# Patient Record
Sex: Male | Born: 1960 | Race: White | Hispanic: No | Marital: Married | Smoking: Never smoker
Health system: Southern US, Community
[De-identification: ages and names within clinical notes are randomized; demographics above are authoritative.]

## PROBLEM LIST (undated history)

## (undated) DIAGNOSIS — E785 Hyperlipidemia, unspecified: Secondary | ICD-10-CM

## (undated) DIAGNOSIS — M549 Dorsalgia, unspecified: Secondary | ICD-10-CM

## (undated) DIAGNOSIS — F419 Anxiety disorder, unspecified: Secondary | ICD-10-CM

## (undated) DIAGNOSIS — N200 Calculus of kidney: Secondary | ICD-10-CM

## (undated) HISTORY — DX: Dorsalgia, unspecified: M54.9

## (undated) HISTORY — PX: MOUTH SURGERY: SHX715

## (undated) HISTORY — DX: Calculus of kidney: N20.0

## (undated) HISTORY — DX: Anxiety disorder, unspecified: F41.9

## (undated) HISTORY — DX: Hyperlipidemia, unspecified: E78.5

---

## 2010-03-30 ENCOUNTER — Encounter: Admission: RE | Admit: 2010-03-30 | Discharge: 2010-03-30 | Payer: Self-pay | Admitting: Family Medicine

## 2011-07-28 ENCOUNTER — Ambulatory Visit
Admission: RE | Admit: 2011-07-28 | Discharge: 2011-07-28 | Disposition: A | Discharge status: Home / Self Care | Payer: BC Managed Care – PPO | Source: Ambulatory Visit | Attending: Family Medicine | Admitting: Family Medicine

## 2011-07-28 ENCOUNTER — Other Ambulatory Visit: Payer: Self-pay | Admitting: Family Medicine

## 2011-07-28 DIAGNOSIS — R05 Cough: Secondary | ICD-10-CM

## 2011-07-31 ENCOUNTER — Encounter: Payer: Self-pay | Admitting: Pulmonary Disease

## 2011-08-01 ENCOUNTER — Institutional Professional Consult (permissible substitution): Payer: Self-pay | Admitting: Pulmonary Disease

## 2017-03-30 ENCOUNTER — Other Ambulatory Visit: Payer: Self-pay | Admitting: Family Medicine

## 2017-03-30 DIAGNOSIS — M545 Low back pain: Secondary | ICD-10-CM

## 2017-03-30 DIAGNOSIS — R1032 Left lower quadrant pain: Secondary | ICD-10-CM

## 2017-04-04 ENCOUNTER — Ambulatory Visit
Admission: RE | Admit: 2017-04-04 | Discharge: 2017-04-04 | Disposition: A | Discharge status: Home / Self Care | Payer: 59 | Source: Ambulatory Visit | Attending: Family Medicine | Admitting: Family Medicine

## 2017-04-04 DIAGNOSIS — M545 Low back pain: Secondary | ICD-10-CM

## 2017-04-04 DIAGNOSIS — R1032 Left lower quadrant pain: Secondary | ICD-10-CM

## 2017-04-04 MED ORDER — IOPAMIDOL (ISOVUE-300) INJECTION 61%
100.0000 mL | Freq: Once | INTRAVENOUS | Status: AC | PRN
  Administered 2017-04-04: 100 mL via INTRAVENOUS

## 2018-11-28 DIAGNOSIS — Z125 Encounter for screening for malignant neoplasm of prostate: Secondary | ICD-10-CM | POA: Diagnosis not present

## 2018-11-28 DIAGNOSIS — E7800 Pure hypercholesterolemia, unspecified: Secondary | ICD-10-CM | POA: Diagnosis not present

## 2018-11-28 DIAGNOSIS — R946 Abnormal results of thyroid function studies: Secondary | ICD-10-CM | POA: Diagnosis not present

## 2018-11-28 DIAGNOSIS — Z0000 Encounter for general adult medical examination without abnormal findings: Secondary | ICD-10-CM | POA: Diagnosis not present

## 2018-12-08 ENCOUNTER — Emergency Department (HOSPITAL_COMMUNITY): Payer: BLUE CROSS/BLUE SHIELD

## 2018-12-08 ENCOUNTER — Encounter (HOSPITAL_COMMUNITY): Payer: Self-pay | Admitting: Emergency Medicine

## 2018-12-08 ENCOUNTER — Emergency Department (HOSPITAL_COMMUNITY)
Admission: EM | Admit: 2018-12-08 | Discharge: 2018-12-09 | Disposition: A | Discharge status: Home / Self Care | Payer: BLUE CROSS/BLUE SHIELD | Attending: Emergency Medicine | Admitting: Emergency Medicine

## 2018-12-08 ENCOUNTER — Other Ambulatory Visit: Payer: Self-pay

## 2018-12-08 DIAGNOSIS — N200 Calculus of kidney: Secondary | ICD-10-CM | POA: Diagnosis not present

## 2018-12-08 DIAGNOSIS — N132 Hydronephrosis with renal and ureteral calculous obstruction: Secondary | ICD-10-CM | POA: Diagnosis not present

## 2018-12-08 DIAGNOSIS — Z79899 Other long term (current) drug therapy: Secondary | ICD-10-CM | POA: Insufficient documentation

## 2018-12-08 DIAGNOSIS — R7989 Other specified abnormal findings of blood chemistry: Secondary | ICD-10-CM | POA: Diagnosis not present

## 2018-12-08 DIAGNOSIS — R109 Unspecified abdominal pain: Secondary | ICD-10-CM | POA: Diagnosis not present

## 2018-12-08 DIAGNOSIS — F419 Anxiety disorder, unspecified: Secondary | ICD-10-CM | POA: Insufficient documentation

## 2018-12-08 DIAGNOSIS — R1032 Left lower quadrant pain: Secondary | ICD-10-CM | POA: Diagnosis not present

## 2018-12-08 LAB — CBC WITH DIFFERENTIAL/PLATELET
Abs Immature Granulocytes: 0.05 10*3/uL (ref 0.00–0.07)
BASOS ABS: 0 10*3/uL (ref 0.0–0.1)
Basophils Relative: 0 %
EOS ABS: 0.1 10*3/uL (ref 0.0–0.5)
Eosinophils Relative: 1 %
HEMATOCRIT: 44.3 % (ref 39.0–52.0)
Hemoglobin: 14.8 g/dL (ref 13.0–17.0)
IMMATURE GRANULOCYTES: 1 %
LYMPHS ABS: 0.9 10*3/uL (ref 0.7–4.0)
Lymphocytes Relative: 9 %
MCH: 29.5 pg (ref 26.0–34.0)
MCHC: 33.4 g/dL (ref 30.0–36.0)
MCV: 88.4 fL (ref 80.0–100.0)
Monocytes Absolute: 0.6 10*3/uL (ref 0.1–1.0)
Monocytes Relative: 6 %
NEUTROS ABS: 8.1 10*3/uL — AB (ref 1.7–7.7)
NEUTROS PCT: 83 %
PLATELETS: 208 10*3/uL (ref 150–400)
RBC: 5.01 MIL/uL (ref 4.22–5.81)
RDW: 12.3 % (ref 11.5–15.5)
WBC: 9.7 10*3/uL (ref 4.0–10.5)
nRBC: 0 % (ref 0.0–0.2)

## 2018-12-08 LAB — URINALYSIS, ROUTINE W REFLEX MICROSCOPIC
BILIRUBIN URINE: NEGATIVE
GLUCOSE, UA: NEGATIVE mg/dL
KETONES UR: 5 mg/dL — AB
LEUKOCYTE UA: NEGATIVE
Nitrite: NEGATIVE
PH: 5 (ref 5.0–8.0)
PROTEIN: 30 mg/dL — AB
Specific Gravity, Urine: 1.03 (ref 1.005–1.030)

## 2018-12-08 LAB — BASIC METABOLIC PANEL
ANION GAP: 7 (ref 5–15)
BUN: 27 mg/dL — ABNORMAL HIGH (ref 6–20)
CALCIUM: 9 mg/dL (ref 8.9–10.3)
CO2: 26 mmol/L (ref 22–32)
Chloride: 103 mmol/L (ref 98–111)
Creatinine, Ser: 1.73 mg/dL — ABNORMAL HIGH (ref 0.61–1.24)
GFR, EST AFRICAN AMERICAN: 50 mL/min — AB (ref >60)
GFR, EST NON AFRICAN AMERICAN: 43 mL/min — AB (ref >60)
Glucose, Bld: 103 mg/dL — ABNORMAL HIGH (ref 70–99)
Potassium: 6.5 mmol/L (ref 3.5–5.1)
Sodium: 136 mmol/L (ref 135–145)

## 2018-12-08 MED ORDER — SODIUM ZIRCONIUM CYCLOSILICATE 10 G PO PACK
10.0000 g | PACK | ORAL | Status: AC
  Administered 2018-12-08: 10 g via ORAL
  Filled 2018-12-08: qty 1

## 2018-12-08 MED ORDER — TAMSULOSIN HCL 0.4 MG PO CAPS
0.4000 mg | ORAL_CAPSULE | ORAL | Status: AC
  Administered 2018-12-08: 0.4 mg via ORAL
  Filled 2018-12-08: qty 1

## 2018-12-08 MED ORDER — ONDANSETRON HCL 4 MG/2ML IJ SOLN
4.0000 mg | Freq: Once | INTRAMUSCULAR | Status: AC
  Administered 2018-12-08: 4 mg via INTRAVENOUS
  Filled 2018-12-08: qty 2

## 2018-12-08 MED ORDER — MORPHINE SULFATE (PF) 4 MG/ML IV SOLN
4.0000 mg | Freq: Once | INTRAVENOUS | Status: AC
  Administered 2018-12-08: 4 mg via INTRAVENOUS
  Filled 2018-12-08: qty 1

## 2018-12-08 MED ORDER — KETOROLAC TROMETHAMINE 15 MG/ML IJ SOLN
15.0000 mg | Freq: Once | INTRAMUSCULAR | Status: AC
  Administered 2018-12-08: 15 mg via INTRAVENOUS
  Filled 2018-12-08: qty 1

## 2018-12-08 MED ORDER — SODIUM CHLORIDE 0.9 % IV BOLUS (SEPSIS)
1000.0000 mL | Freq: Once | INTRAVENOUS | Status: AC
Start: 2018-12-08 — End: 2018-12-08
  Administered 2018-12-08: 1000 mL via INTRAVENOUS

## 2018-12-08 NOTE — ED Notes (Signed)
Date and time results received: 12/08/18 2121 (use smartphrase ".now" to insert current time)  Test: K+ Critical Value: 6.5  Name of Provider Notified: Ronnie Doss  Orders Received? Or Actions Taken?: Actions Taken: notified Ronnie Doss of K+ 6.5

## 2018-12-08 NOTE — ED Triage Notes (Signed)
Pt reports left flank and testicle pain with urine retention for 2-3 hours. Hx kidney stone.

## 2018-12-08 NOTE — ED Provider Notes (Signed)
00:00: Assumed care of patient from [facility] PA-C at change of shift pending repeat BMP. Plan for discharge home w/ pain control & flomax if potassium has normalized (no need for discharge meds for hyperkalemia if normal per prior PA/attending) and admission should patient remain hyperkalemic.   Please see prior provider for full H&P. Briefly patient is a 58 yo male with a hx of kidney stones who presented to the ER with complaints of L flank pain w/ dysuria similar to prior kidney stones. He has been able to pass prior stones without intervention. Sxs felt similar to prior nephrolithiasis.    Physical Exam  BP 122/74   Pulse 69   Temp 98.6 F (37 C) (Oral)   Resp 15   Ht 6' (1.829 m)   Wt 82.6 kg   SpO2 94%   BMI 24.68 kg/m   Physical Exam Vitals signs and nursing note reviewed.  Constitutional:      General: He is not in acute distress.    Appearance: He is well-developed.  HENT:     Head: Normocephalic and atraumatic.  Eyes:     General:        Right eye: No discharge.        Left eye: No discharge.     Conjunctiva/sclera: Conjunctivae normal.  Neurological:     Mental Status: He is alert.     Comments: Clear speech.   Psychiatric:        Behavior: Behavior normal.        Thought Content: Thought content normal.     ED Course/Procedures   Results for orders placed or performed during the hospital encounter of 12/08/18  Urinalysis, Routine w reflex microscopic- may I&O cath if menses  Result Value Ref Range   Color, Urine YELLOW YELLOW   APPearance HAZY (A) CLEAR   Specific Gravity, Urine 1.030 1.005 - 1.030   pH 5.0 5.0 - 8.0   Glucose, UA NEGATIVE NEGATIVE mg/dL   Hgb urine dipstick LARGE (A) NEGATIVE   Bilirubin Urine NEGATIVE NEGATIVE   Ketones, ur 5 (A) NEGATIVE mg/dL   Protein, ur 30 (A) NEGATIVE mg/dL   Nitrite NEGATIVE NEGATIVE   Leukocytes,Ua NEGATIVE NEGATIVE   RBC / HPF >50 (H) 0 - 5 RBC/hpf   WBC, UA 0-5 0 - 5 WBC/hpf   Bacteria, UA RARE (A)  NONE SEEN   Squamous Epithelial / LPF 0-5 0 - 5   Mucus PRESENT   CBC with Differential  Result Value Ref Range   WBC 9.7 4.0 - 10.5 K/uL   RBC 5.01 4.22 - 5.81 MIL/uL   Hemoglobin 14.8 13.0 - 17.0 g/dL   HCT 16.1 09.6 - 04.5 %   MCV 88.4 80.0 - 100.0 fL   MCH 29.5 26.0 - 34.0 pg   MCHC 33.4 30.0 - 36.0 g/dL   RDW 40.9 81.1 - 91.4 %   Platelets 208 150 - 400 K/uL   nRBC 0.0 0.0 - 0.2 %   Neutrophils Relative % 83 %   Neutro Abs 8.1 (H) 1.7 - 7.7 K/uL   Lymphocytes Relative 9 %   Lymphs Abs 0.9 0.7 - 4.0 K/uL   Monocytes Relative 6 %   Monocytes Absolute 0.6 0.1 - 1.0 K/uL   Eosinophils Relative 1 %   Eosinophils Absolute 0.1 0.0 - 0.5 K/uL   Basophils Relative 0 %   Basophils Absolute 0.0 0.0 - 0.1 K/uL   Immature Granulocytes 1 %   Abs Immature Granulocytes 0.05  0.00 - 0.07 K/uL  Basic metabolic panel  Result Value Ref Range   Sodium 136 135 - 145 mmol/L   Potassium 6.5 (HH) 3.5 - 5.1 mmol/L   Chloride 103 98 - 111 mmol/L   CO2 26 22 - 32 mmol/L   Glucose, Bld 103 (H) 70 - 99 mg/dL   BUN 27 (H) 6 - 20 mg/dL   Creatinine, Ser 7.35 (H) 0.61 - 1.24 mg/dL   Calcium 9.0 8.9 - 78.9 mg/dL   GFR calc non Af Amer 43 (L) >60 mL/min   GFR calc Af Amer 50 (L) >60 mL/min   Anion gap 7 5 - 15  Basic metabolic panel  Result Value Ref Range   Sodium 138 135 - 145 mmol/L   Potassium 3.8 3.5 - 5.1 mmol/L   Chloride 109 98 - 111 mmol/L   CO2 22 22 - 32 mmol/L   Glucose, Bld 108 (H) 70 - 99 mg/dL   BUN 26 (H) 6 - 20 mg/dL   Creatinine, Ser 7.84 (H) 0.61 - 1.24 mg/dL   Calcium 8.2 (L) 8.9 - 10.3 mg/dL   GFR calc non Af Amer 55 (L) >60 mL/min   GFR calc Af Amer >60 >60 mL/min   Anion gap 7 5 - 15   US Renal  Result Date: 12/08/2018 CLINICAL DATA:  Left flank pain EXAM: RENAL / URINARY TRACT ULTRASOUND COMPLETE COMPARISON:  CT 04/04/2017 FINDINGS: Right Kidney: Renal measurements: 10.9 x 4.6 x 5.3 cm = volume: 139 mL . Echogenicity within normal limits. No mass or hydronephrosis  visualized. Left Kidney: Renal measurements: 10.8 x 5.8 x 4.8 cm = volume: 157 mL. Echogenicity within normal limits. No mass or hydronephrosis visualized. Bladder: Appears normal for degree of bladder distention. IMPRESSION: Normal renal ultrasound. Electronically Signed   By: Charlett Nose M.D.   On: 12/08/2018 21:03   Ct Renal Stone Study  Result Date: 12/08/2018 CLINICAL DATA:  58 y/o M; left flank pain and testicle pain with urine retention for 2-3 hours. EXAM: CT ABDOMEN AND PELVIS WITHOUT CONTRAST TECHNIQUE: Multidetector CT imaging of the abdomen and pelvis was performed following the standard protocol without IV contrast. COMPARISON:  04/04/2017 CT abdomen and pelvis. FINDINGS: Lower chest: No acute abnormality. Hepatobiliary: No focal liver abnormality is seen. No gallstones, gallbladder wall thickening, or biliary dilatation. Pancreas: Unremarkable. No pancreatic ductal dilatation or surrounding inflammatory changes. Spleen: Normal in size without focal abnormality. Adrenals/Urinary Tract: Adrenal glands are unremarkable. Punctate nonobstructing stones in the kidneys bilaterally. Mild left hydronephrosis and perinephric stranding with a 3 mm stone in the distal left ureter just upstream to ureterovesicular junction (series 5, image 86). Stomach/Bowel: Stomach is within normal limits. Appendix appears normal. No evidence of bowel wall thickening, distention, or inflammatory changes. Vascular/Lymphatic: No significant vascular findings are present. No enlarged abdominal or pelvic lymph nodes. Reproductive: Prostate is unremarkable. Other: No abdominal wall hernia or abnormality. No abdominopelvic ascites. Musculoskeletal: No fracture is seen. Moderate loss of L5-1 intervertebral disc space height with vacuum phenomenon and lower lumbar facet arthrosis. IMPRESSION: 1. Mild left hydronephrosis and perinephric stranding with a 3 mm stone in the distal left ureter just upstream to ureterovesicular junction.  2. Punctate nonobstructing stones in the kidneys bilaterally. Electronically Signed   By: Mitzi Hansen M.D.   On: 12/08/2018 23:47    Procedures    MDM   I have personally reviewed patient's work-up.   Labs reviewed:  CBC: no leukocytosis or anemia BMP (initial): Hyperkalemia at 6.5.  Creatinine/BUN 1.73/27 - no hx of prior CKD, no old labs on record UA: Hematuria, rare bacteria- nitrite/leuk negative, culture added Renal US normal, CT scan subsequently obtained reveals mild L hydronephrosis & perinephritic stranding with a 3 mm distal left ureter stone.   Patient was given pain analgesics & fluids by prior team w/ good pain control, he was given [facility] for his hyperkalemia, EKG was obtained without concerning findings in setting of electrolyte disturbance.   Prior provider spoke with urology- recommendation for discharge home pending improved potassium.   Repeat BMP: Potassium normalized to 3.8, creatinine has improved. Will discharge home with flomax, zofran, & percocet, NSAIDs avoided secondary to elevated creatinine- we discussed avoidance of nephrotoxic agents. Urology follow up to be provided.   I discussed results, treatment plan, need for follow-up, and return precautions with the patient. Provided opportunity for questions, patient confirmed understanding and is in agreement with plan.   Findings and plan of care discussed with supervising physician Dr. Rush Landmark who is in agreement.        Cherly Anderson, [place] 12/09/18 1735    Tegeler, Canary Brim, MD 12/09/18 1046

## 2018-12-09 LAB — BASIC METABOLIC PANEL
Anion gap: 7 (ref 5–15)
BUN: 26 mg/dL — AB (ref 6–20)
CHLORIDE: 109 mmol/L (ref 98–111)
CO2: 22 mmol/L (ref 22–32)
CREATININE: 1.43 mg/dL — AB (ref 0.61–1.24)
Calcium: 8.2 mg/dL — ABNORMAL LOW (ref 8.9–10.3)
GFR calc Af Amer: >60 mL/min (ref >60)
GFR calc non Af Amer: 55 mL/min — ABNORMAL LOW (ref >60)
Glucose, Bld: 108 mg/dL — ABNORMAL HIGH (ref 70–99)
Potassium: 3.8 mmol/L (ref 3.5–5.1)
SODIUM: 138 mmol/L (ref 135–145)

## 2018-12-09 MED ORDER — TAMSULOSIN HCL 0.4 MG PO CAPS: 0.4000 mg | ORAL_CAPSULE | Freq: Every day | ORAL | 0 refills | Status: DC

## 2018-12-09 MED ORDER — MORPHINE SULFATE (PF) 4 MG/ML IV SOLN
4.0000 mg | Freq: Once | INTRAVENOUS | Status: AC
  Administered 2018-12-09: 4 mg via INTRAVENOUS
  Filled 2018-12-09: qty 1

## 2018-12-09 MED ORDER — OXYCODONE-ACETAMINOPHEN 5-325 MG PO TABS: 1.0000 | ORAL_TABLET | Freq: Four times a day (QID) | ORAL | 0 refills | Status: DC | PRN

## 2018-12-09 MED ORDER — ONDANSETRON 4 MG PO TBDP: 4.0000 mg | ORAL_TABLET | Freq: Three times a day (TID) | ORAL | 0 refills | Status: DC | PRN

## 2018-12-09 NOTE — ED Provider Notes (Signed)
[facility]-EMERGENCY DEPT Provider Note   CSN: [id number] Arrival date & time: 12/08/18  1806    History   Chief Complaint Chief Complaint  Patient presents with  . Flank Pain  . urine retention    HPI Christopher Cochran is a 58 y.o. male.     Patient is a 58 year old male with past medical history of kidney stones who presents emergency department for left-sided flank pain.  Reports that it began early this morning and has progressively gotten worse.  Reports that it feels the same as kidney stones in the past.  He has had 2 stones in the past on the left side which have both been small enough for him to pass on his own.  He is not needed to see urology for this.  Reports that he is having some difficulty urinating and the pain radiates from his left flank down into his left groin.  Denies any nausea, vomiting, fever, chills, hematuria, penile discharge.  Has not tried anything for relief.     Past Medical History:  Diagnosis Date  . Anxiety   . Back pain   . Hyperlipidemia   . Kidney stone     There are no active problems to display for this patient.   Past Surgical History:  Procedure Laterality Date  . MOUTH SURGERY          Home Medications    Prior to Admission medications   Medication Sig Start Date End Date Taking? Authorizing Provider  ibuprofen (ADVIL,MOTRIN) 200 MG tablet Take 400 mg by mouth every 6 (six) hours as needed for headache or moderate pain.   Yes [provider]  sertraline (ZOLOFT) 50 MG tablet Take 50 mg by mouth daily. 11/28/18  Yes [provider]  ALPRAZolam Prudy Feeler) 0.5 MG tablet Take 0.5 mg by mouth every 7 (seven) days.     [provider]  atorvastatin (LIPITOR) 40 MG tablet Take 40 mg by mouth daily. 12/04/18   [provider]  ondansetron (ZOFRAN ODT) 4 MG disintegrating tablet Take 1 tablet (4 mg total) by mouth every 8 (eight) hours as needed for nausea or vomiting. 12/09/18    Petrucelli, Samantha R, PA-C  oxyCODONE-acetaminophen (PERCOCET/ROXICET) 5-325 MG tablet Take 1-2 tablets by mouth every 6 (six) hours as needed for severe pain. 12/09/18   Petrucelli, Samantha R, PA-C  tamsulosin (FLOMAX) 0.4 MG CAPS capsule Take 1 capsule (0.4 mg total) by mouth daily after supper. 12/09/18   Petrucelli, Pleas Koch, PA-C    Family History Family History  Problem Relation Age of Onset  . Alzheimer's disease Father   . Lung cancer Father   . Breast cancer Mother   . Hypertension Mother   . Diabetes Mother   . Hyperlipidemia Mother   . Cancer Maternal Grandfather   . Heart attack Maternal Grandfather   . Stroke Maternal Grandmother   . Aneurysm Sister   . Aneurysm Sister     Social History Social History   Tobacco Use  . Smoking status: Never Smoker  . Smokeless tobacco: Never Used  Substance Use Topics  . Alcohol use: Not on file  . Drug use: Not on file     Allergies   Patient has no known allergies.   Review of Systems Review of Systems  Constitutional: Negative for chills and fever.  HENT: Negative for ear pain and sore throat.   Eyes: Negative for pain and visual disturbance.  Respiratory: Negative for cough and shortness  of breath.   Cardiovascular: Negative for chest pain and palpitations.  Gastrointestinal: Positive for abdominal pain. Negative for diarrhea, nausea and vomiting.  Genitourinary: Positive for difficulty urinating, dysuria, flank pain, testicular pain and urgency. Negative for decreased urine volume, discharge, frequency, genital sores, hematuria, penile pain, penile swelling and scrotal swelling.  Musculoskeletal: Negative for arthralgias and back pain.  Skin: Negative for color change and rash.  Neurological: Negative for seizures and syncope.  All other systems reviewed and are negative.    Physical Exam Updated Vital Signs BP 121/66   Pulse 63   Temp 98.6 F (37 C) (Oral)   Resp 13   Ht 6' (1.829 m)   Wt 82.6 kg    SpO2 95%   BMI 24.68 kg/m   Physical Exam Vitals signs and nursing note reviewed.  HENT:     Head: Normocephalic and atraumatic.     Nose: Nose normal.     Mouth/Throat:     Mouth: Mucous membranes are moist.  Eyes:     Conjunctiva/sclera: Conjunctivae normal.     Pupils: Pupils are equal, round, and reactive to light.  Cardiovascular:     Rate and Rhythm: Normal rate and regular rhythm.  Pulmonary:     Effort: Pulmonary effort is normal.     Breath sounds: Normal breath sounds. No wheezing, rhonchi or rales.  Abdominal:     General: Abdomen is flat. Bowel sounds are normal. There is no distension.     Palpations: There is no mass.     Tenderness: There is left CVA tenderness. There is no right CVA tenderness.  Musculoskeletal:     Right lower leg: No edema.     Left lower leg: No edema.  Skin:    General: Skin is warm.     Capillary Refill: Capillary refill takes less than 2 seconds.  Neurological:     General: No focal deficit present.     Mental Status: He is alert.  Psychiatric:        Mood and Affect: Mood normal.      ED Treatments / Results  Labs (all labs ordered are listed, but only abnormal results are displayed) Labs Reviewed  URINALYSIS, ROUTINE W REFLEX MICROSCOPIC - Abnormal; Notable for the following components:      Result Value   APPearance HAZY (*)    Hgb urine dipstick LARGE (*)    Ketones, ur 5 (*)    Protein, ur 30 (*)    RBC / HPF >50 (*)    Bacteria, UA RARE (*)    All other components within normal limits  CBC WITH DIFFERENTIAL/PLATELET - Abnormal; Notable for the following components:   Neutro Abs 8.1 (*)    All other components within normal limits  BASIC METABOLIC PANEL - Abnormal; Notable for the following components:   Potassium 6.5 (*)    Glucose, Bld 103 (*)    BUN 27 (*)    Creatinine, Ser 1.73 (*)    GFR calc non Af Amer 43 (*)    GFR calc Af Amer 50 (*)    All other components within normal limits  BASIC METABOLIC PANEL -  Abnormal; Notable for the following components:   Glucose, Bld 108 (*)    BUN 26 (*)    Creatinine, Ser 1.43 (*)    Calcium 8.2 (*)    GFR calc non Af Amer 55 (*)    All other components within normal limits  URINE CULTURE    EKG  EKG Interpretation  Date/Time:  Sunday December 08 2018 21:51:40 EST Ventricular Rate:  70 PR Interval:    QRS Duration: 92 QT Interval:  396 QTC Calculation: 428 R Axis:   86 Text Interpretation:  Sinus rhythm Normal ECG No old tracing to compare Confirmed by Dione Booze ([id number]) on 12/09/2018 1:33:31 AM Also confirmed by Dione Booze [id number]), editor [address], Shanda Bumps [phone])  on 12/09/2018 11:59:25 AM   Radiology US Renal  Result Date: 12/08/2018 CLINICAL DATA:  Left flank pain EXAM: RENAL / URINARY TRACT ULTRASOUND COMPLETE COMPARISON:  CT 04/04/2017 FINDINGS: Right Kidney: Renal measurements: 10.9 x 4.6 x 5.3 cm = volume: 139 mL . Echogenicity within normal limits. No mass or hydronephrosis visualized. Left Kidney: Renal measurements: 10.8 x 5.8 x 4.8 cm = volume: 157 mL. Echogenicity within normal limits. No mass or hydronephrosis visualized. Bladder: Appears normal for degree of bladder distention. IMPRESSION: Normal renal ultrasound. Electronically Signed   By: Charlett Nose M.D.   On: 12/08/2018 21:03   Ct Renal Stone Study  Result Date: 12/08/2018 CLINICAL DATA:  58 y/o M; left flank pain and testicle pain with urine retention for 2-3 hours. EXAM: CT ABDOMEN AND PELVIS WITHOUT CONTRAST TECHNIQUE: Multidetector CT imaging of the abdomen and pelvis was performed following the standard protocol without IV contrast. COMPARISON:  04/04/2017 CT abdomen and pelvis. FINDINGS: Lower chest: No acute abnormality. Hepatobiliary: No focal liver abnormality is seen. No gallstones, gallbladder wall thickening, or biliary dilatation. Pancreas: Unremarkable. No pancreatic ductal dilatation or surrounding inflammatory changes. Spleen: Normal in size without focal  abnormality. Adrenals/Urinary Tract: Adrenal glands are unremarkable. Punctate nonobstructing stones in the kidneys bilaterally. Mild left hydronephrosis and perinephric stranding with a 3 mm stone in the distal left ureter just upstream to ureterovesicular junction (series 5, image 86). Stomach/Bowel: Stomach is within normal limits. Appendix appears normal. No evidence of bowel wall thickening, distention, or inflammatory changes. Vascular/Lymphatic: No significant vascular findings are present. No enlarged abdominal or pelvic lymph nodes. Reproductive: Prostate is unremarkable. Other: No abdominal wall hernia or abnormality. No abdominopelvic ascites. Musculoskeletal: No fracture is seen. Moderate loss of L5-1 intervertebral disc space height with vacuum phenomenon and lower lumbar facet arthrosis. IMPRESSION: 1. Mild left hydronephrosis and perinephric stranding with a 3 mm stone in the distal left ureter just upstream to ureterovesicular junction. 2. Punctate nonobstructing stones in the kidneys bilaterally. Electronically Signed   By: Mitzi Hansen M.D.   On: 12/08/2018 23:47    Procedures Procedures (including critical care time)  Medications Ordered in ED Medications  sodium chloride 0.9 % bolus 1,000 mL (0 mLs Intravenous Stopped 12/08/18 2054)  ketorolac (TORADOL) 15 MG/ML injection 15 mg (15 mg Intravenous Given 12/08/18 1957)  ondansetron (ZOFRAN) injection 4 mg (4 mg Intravenous Given 12/08/18 1956)  tamsulosin (FLOMAX) capsule 0.4 mg (0.4 mg Oral Given 12/08/18 2247)  sodium zirconium cyclosilicate (LOKELMA) packet 10 g (10 g Oral Given 12/08/18 2248)  morphine 4 MG/ML injection 4 mg (4 mg Intravenous Given 12/08/18 2249)  morphine 4 MG/ML injection 4 mg (4 mg Intravenous Given 12/09/18 0100)     Initial Impression / Assessment and Plan / ED Course  I have reviewed the triage vital signs and the nursing notes.  Pertinent labs & imaging results that were available during my care  of the patient were reviewed by me and considered in my medical decision making (see chart for details).  Clinical Course as of Dec 09 1544  Sun Dec 08, 2018  48 58 year old  male with past medical history of kidney stones presents to the emergency department for left-sided flank pain, dysuria, urinary retention which is consistent with his symptoms in the past previous kidney stones.  In the past he has had 2 kidney stones and has been able to pass them on his own.  Reports that he still had some residual stones as well.  I had a long discussion with the patient about imaging options as far as CT scan versus ultrasound and the risks and benefits of both imaging modalities.  Given the fact that this is presenting as previous stones and the previous stones have been small and passable I think that it is reasonable to obtain just a renal ultrasound at this time.  I gave the patient an option and he agrees with obtaining an ultrasound to start with.   [KM]  2242 Patient's labs are significant for potassium of 6.5 and creatinine of 1.73.  His urinalysis is showing a large amount hemoglobin, positive ketones, positive protein, rare bacteria.  His pain is controlled with Toradol.  He does not have significant suprapubic tenderness and his bladder scan was normal.  His renal ultrasound was also normal.  He is getting fluids and lokelma and flomax for suspected renal stone. Given his abnormal labs I am going to get a CT renal stone study. Case was discussed with Dr. Rush Landmark and plan agreed upon.    [KM]  Mon Dec 09, 2018  0015 I spoke with urology on call for this patient. He reports that the patient will be fine to be discharged if potassium comes down. He believes patient can pass stone on his own.    [KM]    Clinical Course User Index [KM] Arlyn Dunning, PA-C       Based on review of vitals, medical screening exam, lab work and/or imaging, there does not appear to be an acute, emergent etiology for  the patient's symptoms. Counseled pt on good return precautions and encouraged both PCP and ED follow-up as needed.  Prior to discharge, I also discussed incidental imaging findings with patient in detail and advised appropriate, recommended follow-up in detail.  Clinical Impression: 1. Kidney stone   2. Elevated serum creatinine     Disposition: Discharge  Prior to providing a prescription for a controlled substance, I independently reviewed the patient's recent prescription history on the [place] Controlled Substance Reporting System. The patient had no recent or regular prescriptions and was deemed appropriate for a brief, less than 3 day prescription of narcotic for acute analgesia.  This note was prepared with assistance of Conservation officer, historic buildings. Occasional wrong-word or sound-a-like substitutions may have occurred due to the inherent limitations of voice recognition software.   Final Clinical Impressions(s) / ED Diagnoses   Final diagnoses:  Kidney stone  Elevated serum creatinine    ED Discharge Orders         Ordered    ondansetron (ZOFRAN ODT) 4 MG disintegrating tablet  Every 8 hours PRN     12/09/18 0221    oxyCODONE-acetaminophen (PERCOCET/ROXICET) 5-325 MG tablet  Every 6 hours PRN     12/09/18 0221    tamsulosin (FLOMAX) 0.4 MG CAPS capsule  Daily after supper     12/09/18 0221           Arlyn Dunning, PA-C 12/09/18 1546    Tegeler, Canary Brim, MD 12/10/18 [id number]

## 2018-12-09 NOTE — Discharge Instructions (Addendum)
You were seen in the emergency department and found to have a kidney stone that is 38mm in size (formal report below)  We are sending you home with multiple medications to assist with passing the stone:   -Flomax-this is a medication to help pass the stone, it allows urine to exit the body more freely.  Please take this once daily with a meal.  -Percocet-this is a narcotic/controlled substance medication that has potential addicting qualities.  We recommend that you take 1-2 tablets every 6 hours as needed for severe pain.  Do not drive or operate heavy machinery when taking this medicine as it can be sedating. Do not drink alcohol or take other sedating medications when taking this medicine for safety reasons.  Keep this out of reach of small children.  Please be aware this medicine has Tylenol in it (325 mg/tab) do not exceed the maximum dose of Tylenol in a day per over the counter recommendations should you decide to supplement with Tylenol over the counter.   -Zofran-this is an antinausea medication, you may take this every 8 hours as needed for nausea and vomiting, please allow the tablet to dissolve underneath of your tongue.   We have prescribed you new medication(s) today. Discuss the medications prescribed today with your pharmacist as they can have adverse effects and interactions with your other medicines including over the counter and prescribed medications. Seek medical evaluation if you start to experience new or abnormal symptoms after taking one of these medicines, seek care immediately if you start to experience difficulty breathing, feeling of your throat closing, facial swelling, or rash as these could be indications of a more serious allergic reaction  Your creatinine and BUN ( measure of kidney function) were elevated in the ER (labs below). Your potassium was also initially high. We would like you to avoid medicines that can harm your kidneys such as NSAIDs (ibuprofen, motrin, aleve,  advil, mobic, naproxen, etc.). Please have your labs rechecked in 1-3 days.   Please follow-up with the urology group provided in your discharge instructions within 3 days Return to the ER for new or worsening symptoms including but not limited to worsening pain not controlled by these medicines, inability to keep fluids down, fever, or any other concerns that you may have.   Results for orders placed or performed during the hospital encounter of 12/08/18  Urinalysis, Routine w reflex microscopic- may I&O cath if menses  Result Value Ref Range   Color, Urine YELLOW YELLOW   APPearance HAZY (A) CLEAR   Specific Gravity, Urine 1.030 1.005 - 1.030   pH 5.0 5.0 - 8.0   Glucose, UA NEGATIVE NEGATIVE mg/dL   Hgb urine dipstick LARGE (A) NEGATIVE   Bilirubin Urine NEGATIVE NEGATIVE   Ketones, ur 5 (A) NEGATIVE mg/dL   Protein, ur 30 (A) NEGATIVE mg/dL   Nitrite NEGATIVE NEGATIVE   Leukocytes,Ua NEGATIVE NEGATIVE   RBC / HPF >50 (H) 0 - 5 RBC/hpf   WBC, UA 0-5 0 - 5 WBC/hpf   Bacteria, UA RARE (A) NONE SEEN   Squamous Epithelial / LPF 0-5 0 - 5   Mucus PRESENT   CBC with Differential  Result Value Ref Range   WBC 9.7 4.0 - 10.5 K/uL   RBC 5.01 4.22 - 5.81 MIL/uL   Hemoglobin 14.8 13.0 - 17.0 g/dL   HCT 43.1 54.0 - 08.6 %   MCV 88.4 80.0 - 100.0 fL   MCH 29.5 26.0 - 34.0 pg  MCHC 33.4 30.0 - 36.0 g/dL   RDW 48.8 89.1 - 69.4 %   Platelets 208 150 - 400 K/uL   nRBC 0.0 0.0 - 0.2 %   Neutrophils Relative % 83 %   Neutro Abs 8.1 (H) 1.7 - 7.7 K/uL   Lymphocytes Relative 9 %   Lymphs Abs 0.9 0.7 - 4.0 K/uL   Monocytes Relative 6 %   Monocytes Absolute 0.6 0.1 - 1.0 K/uL   Eosinophils Relative 1 %   Eosinophils Absolute 0.1 0.0 - 0.5 K/uL   Basophils Relative 0 %   Basophils Absolute 0.0 0.0 - 0.1 K/uL   Immature Granulocytes 1 %   Abs Immature Granulocytes 0.05 0.00 - 0.07 K/uL  Basic metabolic panel  Result Value Ref Range   Sodium 136 135 - 145 mmol/L   Potassium 6.5 (HH) 3.5  - 5.1 mmol/L   Chloride 103 98 - 111 mmol/L   CO2 26 22 - 32 mmol/L   Glucose, Bld 103 (H) 70 - 99 mg/dL   BUN 27 (H) 6 - 20 mg/dL   Creatinine, Ser 5.03 (H) 0.61 - 1.24 mg/dL   Calcium 9.0 8.9 - 88.8 mg/dL   GFR calc non Af Amer 43 (L) >60 mL/min   GFR calc Af Amer 50 (L) >60 mL/min   Anion gap 7 5 - 15  Basic metabolic panel  Result Value Ref Range   Sodium 138 135 - 145 mmol/L   Potassium 3.8 3.5 - 5.1 mmol/L   Chloride 109 98 - 111 mmol/L   CO2 22 22 - 32 mmol/L   Glucose, Bld 108 (H) 70 - 99 mg/dL   BUN 26 (H) 6 - 20 mg/dL   Creatinine, Ser 2.80 (H) 0.61 - 1.24 mg/dL   Calcium 8.2 (L) 8.9 - 10.3 mg/dL   GFR calc non Af Amer 55 (L) >60 mL/min   GFR calc Af Amer >60 >60 mL/min   Anion gap 7 5 - 15   US Renal  Result Date: 12/08/2018 CLINICAL DATA:  Left flank pain EXAM: RENAL / URINARY TRACT ULTRASOUND COMPLETE COMPARISON:  CT 04/04/2017 FINDINGS: Right Kidney: Renal measurements: 10.9 x 4.6 x 5.3 cm = volume: 139 mL . Echogenicity within normal limits. No mass or hydronephrosis visualized. Left Kidney: Renal measurements: 10.8 x 5.8 x 4.8 cm = volume: 157 mL. Echogenicity within normal limits. No mass or hydronephrosis visualized. Bladder: Appears normal for degree of bladder distention. IMPRESSION: Normal renal ultrasound. Electronically Signed   By: Charlett Nose M.D.   On: 12/08/2018 21:03   Ct Renal Stone Study  Result Date: 12/08/2018 CLINICAL DATA:  58 y/o M; left flank pain and testicle pain with urine retention for 2-3 hours. EXAM: CT ABDOMEN AND PELVIS WITHOUT CONTRAST TECHNIQUE: Multidetector CT imaging of the abdomen and pelvis was performed following the standard protocol without IV contrast. COMPARISON:  04/04/2017 CT abdomen and pelvis. FINDINGS: Lower chest: No acute abnormality. Hepatobiliary: No focal liver abnormality is seen. No gallstones, gallbladder wall thickening, or biliary dilatation. Pancreas: Unremarkable. No pancreatic ductal dilatation or surrounding  inflammatory changes. Spleen: Normal in size without focal abnormality. Adrenals/Urinary Tract: Adrenal glands are unremarkable. Punctate nonobstructing stones in the kidneys bilaterally. Mild left hydronephrosis and perinephric stranding with a 3 mm stone in the distal left ureter just upstream to ureterovesicular junction (series 5, image 86). Stomach/Bowel: Stomach is within normal limits. Appendix appears normal. No evidence of bowel wall thickening, distention, or inflammatory changes. Vascular/Lymphatic: No significant vascular findings are  present. No enlarged abdominal or pelvic lymph nodes. Reproductive: Prostate is unremarkable. Other: No abdominal wall hernia or abnormality. No abdominopelvic ascites. Musculoskeletal: No fracture is seen. Moderate loss of L5-1 intervertebral disc space height with vacuum phenomenon and lower lumbar facet arthrosis. IMPRESSION: 1. Mild left hydronephrosis and perinephric stranding with a 3 mm stone in the distal left ureter just upstream to ureterovesicular junction. 2. Punctate nonobstructing stones in the kidneys bilaterally. Electronically Signed   By: Mitzi Hansen M.D.   On: 12/08/2018 23:47

## 2018-12-10 DIAGNOSIS — E875 Hyperkalemia: Secondary | ICD-10-CM | POA: Diagnosis not present

## 2018-12-10 DIAGNOSIS — R8271 Bacteriuria: Secondary | ICD-10-CM | POA: Diagnosis not present

## 2018-12-10 DIAGNOSIS — N23 Unspecified renal colic: Secondary | ICD-10-CM | POA: Diagnosis not present

## 2018-12-10 DIAGNOSIS — N201 Calculus of ureter: Secondary | ICD-10-CM | POA: Diagnosis not present

## 2018-12-10 LAB — URINE CULTURE: Culture: NO GROWTH

## 2018-12-11 DIAGNOSIS — N201 Calculus of ureter: Secondary | ICD-10-CM | POA: Diagnosis not present

## 2018-12-23 DIAGNOSIS — N132 Hydronephrosis with renal and ureteral calculous obstruction: Secondary | ICD-10-CM | POA: Diagnosis not present

## 2018-12-23 DIAGNOSIS — Z23 Encounter for immunization: Secondary | ICD-10-CM | POA: Diagnosis not present

## 2018-12-23 DIAGNOSIS — E7800 Pure hypercholesterolemia, unspecified: Secondary | ICD-10-CM | POA: Diagnosis not present

## 2018-12-26 DIAGNOSIS — N200 Calculus of kidney: Secondary | ICD-10-CM | POA: Diagnosis not present

## 2019-03-12 DIAGNOSIS — M79641 Pain in right hand: Secondary | ICD-10-CM | POA: Diagnosis not present

## 2019-03-12 DIAGNOSIS — M545 Low back pain: Secondary | ICD-10-CM | POA: Diagnosis not present

## 2019-05-23 DIAGNOSIS — Z23 Encounter for immunization: Secondary | ICD-10-CM | POA: Diagnosis not present

## 2019-12-10 DIAGNOSIS — F419 Anxiety disorder, unspecified: Secondary | ICD-10-CM | POA: Diagnosis not present

## 2019-12-10 DIAGNOSIS — Z0000 Encounter for general adult medical examination without abnormal findings: Secondary | ICD-10-CM | POA: Diagnosis not present

## 2019-12-10 DIAGNOSIS — Z125 Encounter for screening for malignant neoplasm of prostate: Secondary | ICD-10-CM | POA: Diagnosis not present

## 2019-12-10 DIAGNOSIS — E7800 Pure hypercholesterolemia, unspecified: Secondary | ICD-10-CM | POA: Diagnosis not present

## 2020-02-13 DIAGNOSIS — R899 Unspecified abnormal finding in specimens from other organs, systems and tissues: Secondary | ICD-10-CM | POA: Diagnosis not present

## 2020-02-13 DIAGNOSIS — R946 Abnormal results of thyroid function studies: Secondary | ICD-10-CM | POA: Diagnosis not present

## 2020-03-17 DIAGNOSIS — H2513 Age-related nuclear cataract, bilateral: Secondary | ICD-10-CM | POA: Diagnosis not present

## 2020-06-09 DIAGNOSIS — Z03818 Encounter for observation for suspected exposure to other biological agents ruled out: Secondary | ICD-10-CM | POA: Diagnosis not present

## 2020-06-09 DIAGNOSIS — Z20822 Contact with and (suspected) exposure to covid-19: Secondary | ICD-10-CM | POA: Diagnosis not present

## 2020-07-20 DIAGNOSIS — M25521 Pain in right elbow: Secondary | ICD-10-CM | POA: Diagnosis not present

## 2020-07-20 DIAGNOSIS — M25531 Pain in right wrist: Secondary | ICD-10-CM | POA: Diagnosis not present

## 2020-07-28 DIAGNOSIS — M25521 Pain in right elbow: Secondary | ICD-10-CM | POA: Diagnosis not present

## 2020-07-28 DIAGNOSIS — M7711 Lateral epicondylitis, right elbow: Secondary | ICD-10-CM | POA: Diagnosis not present

## 2020-07-28 DIAGNOSIS — M25631 Stiffness of right wrist, not elsewhere classified: Secondary | ICD-10-CM | POA: Diagnosis not present

## 2020-08-06 DIAGNOSIS — M25631 Stiffness of right wrist, not elsewhere classified: Secondary | ICD-10-CM | POA: Diagnosis not present

## 2020-08-06 DIAGNOSIS — M25521 Pain in right elbow: Secondary | ICD-10-CM | POA: Diagnosis not present

## 2020-08-06 DIAGNOSIS — M7711 Lateral epicondylitis, right elbow: Secondary | ICD-10-CM | POA: Diagnosis not present

## 2020-08-12 DIAGNOSIS — M25521 Pain in right elbow: Secondary | ICD-10-CM | POA: Diagnosis not present

## 2020-08-12 DIAGNOSIS — M25631 Stiffness of right wrist, not elsewhere classified: Secondary | ICD-10-CM | POA: Diagnosis not present

## 2020-08-12 DIAGNOSIS — M7711 Lateral epicondylitis, right elbow: Secondary | ICD-10-CM | POA: Diagnosis not present

## 2021-07-04 ENCOUNTER — Other Ambulatory Visit: Payer: Self-pay | Admitting: Family Medicine

## 2021-07-04 DIAGNOSIS — Z9189 Other specified personal risk factors, not elsewhere classified: Secondary | ICD-10-CM

## 2021-07-18 ENCOUNTER — Other Ambulatory Visit (HOSPITAL_BASED_OUTPATIENT_CLINIC_OR_DEPARTMENT_OTHER): Payer: Self-pay | Admitting: Family Medicine

## 2021-07-18 DIAGNOSIS — Z9189 Other specified personal risk factors, not elsewhere classified: Secondary | ICD-10-CM

## 2021-07-20 ENCOUNTER — Other Ambulatory Visit: Payer: 59

## 2021-07-21 ENCOUNTER — Other Ambulatory Visit: Payer: Self-pay

## 2021-07-21 ENCOUNTER — Ambulatory Visit (HOSPITAL_BASED_OUTPATIENT_CLINIC_OR_DEPARTMENT_OTHER)
Admission: RE | Admit: 2021-07-21 | Discharge: 2021-07-21 | Disposition: A | Discharge status: Home / Self Care | Payer: 59 | Source: Ambulatory Visit | Attending: Family Medicine | Admitting: Family Medicine

## 2021-07-21 DIAGNOSIS — Z9189 Other specified personal risk factors, not elsewhere classified: Secondary | ICD-10-CM | POA: Insufficient documentation

## 2021-10-28 DIAGNOSIS — L821 Other seborrheic keratosis: Secondary | ICD-10-CM | POA: Diagnosis not present

## 2021-10-28 DIAGNOSIS — D1801 Hemangioma of skin and subcutaneous tissue: Secondary | ICD-10-CM | POA: Diagnosis not present

## 2021-10-28 DIAGNOSIS — B354 Tinea corporis: Secondary | ICD-10-CM | POA: Diagnosis not present

## 2021-12-21 DIAGNOSIS — R5383 Other fatigue: Secondary | ICD-10-CM | POA: Diagnosis not present

## 2021-12-21 DIAGNOSIS — Z125 Encounter for screening for malignant neoplasm of prostate: Secondary | ICD-10-CM | POA: Diagnosis not present

## 2021-12-21 DIAGNOSIS — Z0000 Encounter for general adult medical examination without abnormal findings: Secondary | ICD-10-CM | POA: Diagnosis not present

## 2021-12-21 DIAGNOSIS — E7800 Pure hypercholesterolemia, unspecified: Secondary | ICD-10-CM | POA: Diagnosis not present

## 2022-01-02 DIAGNOSIS — R5383 Other fatigue: Secondary | ICD-10-CM | POA: Diagnosis not present

## 2022-11-23 DIAGNOSIS — D2272 Melanocytic nevi of left lower limb, including hip: Secondary | ICD-10-CM | POA: Diagnosis not present

## 2022-11-23 DIAGNOSIS — L814 Other melanin hyperpigmentation: Secondary | ICD-10-CM | POA: Diagnosis not present

## 2022-11-23 DIAGNOSIS — D2261 Melanocytic nevi of right upper limb, including shoulder: Secondary | ICD-10-CM | POA: Diagnosis not present

## 2022-11-23 DIAGNOSIS — L821 Other seborrheic keratosis: Secondary | ICD-10-CM | POA: Diagnosis not present

## 2022-11-23 DIAGNOSIS — D2262 Melanocytic nevi of left upper limb, including shoulder: Secondary | ICD-10-CM | POA: Diagnosis not present

## 2022-11-23 DIAGNOSIS — L573 Poikiloderma of Civatte: Secondary | ICD-10-CM | POA: Diagnosis not present

## 2022-11-23 DIAGNOSIS — D1801 Hemangioma of skin and subcutaneous tissue: Secondary | ICD-10-CM | POA: Diagnosis not present

## 2022-11-23 DIAGNOSIS — B354 Tinea corporis: Secondary | ICD-10-CM | POA: Diagnosis not present

## 2022-11-23 DIAGNOSIS — D225 Melanocytic nevi of trunk: Secondary | ICD-10-CM | POA: Diagnosis not present

## 2023-01-08 DIAGNOSIS — E7800 Pure hypercholesterolemia, unspecified: Secondary | ICD-10-CM | POA: Diagnosis not present

## 2023-01-08 DIAGNOSIS — Z125 Encounter for screening for malignant neoplasm of prostate: Secondary | ICD-10-CM | POA: Diagnosis not present

## 2023-03-22 DIAGNOSIS — G4719 Other hypersomnia: Secondary | ICD-10-CM | POA: Diagnosis not present

## 2023-03-22 DIAGNOSIS — E785 Hyperlipidemia, unspecified: Secondary | ICD-10-CM | POA: Diagnosis not present

## 2023-09-15 IMAGING — CT CT CARDIAC CORONARY ARTERY CALCIUM SCORE
3 series · 14 of 20 positions shown, 16 images · non-contrast
Comparison: None.

Addendum:
CLINICAL DATA: Cardiovascular Disease Risk stratification

EXAM:
Coronary Calcium Score
TECHNIQUE: A gated, non-contrast computed tomography scan of the heart was
performed using 3mm slice thickness. Axial images were analyzed on a
dedicated workstation. Calcium scoring of the coronary arteries was
performed using the Agatston method.

[Series 2: ax lung · axial · 0.86mm/px · z∈[+1052,+1164]mm · 5 of 86 slices shown]
[im 15/86  lung]
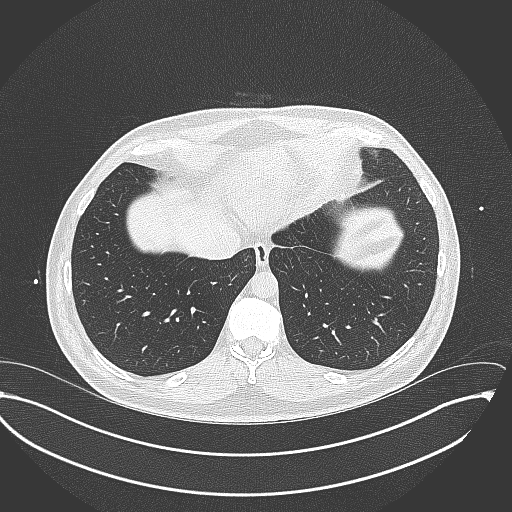
[im 29/86  lung]
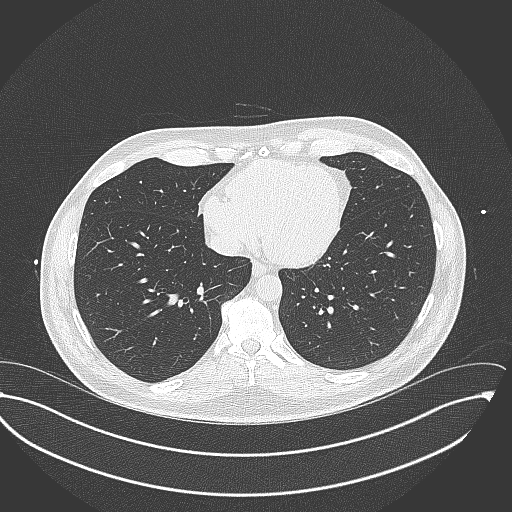
[im 43/86  lung]
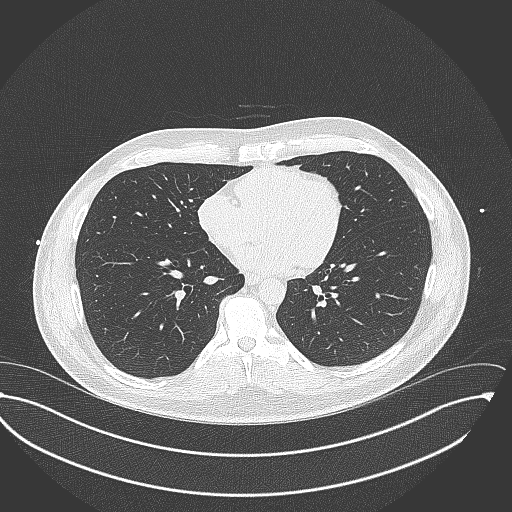
[im 57/86  lung]
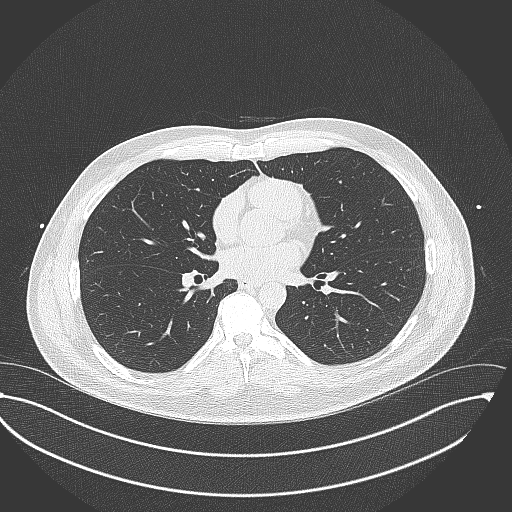
[im 71/86  lung]
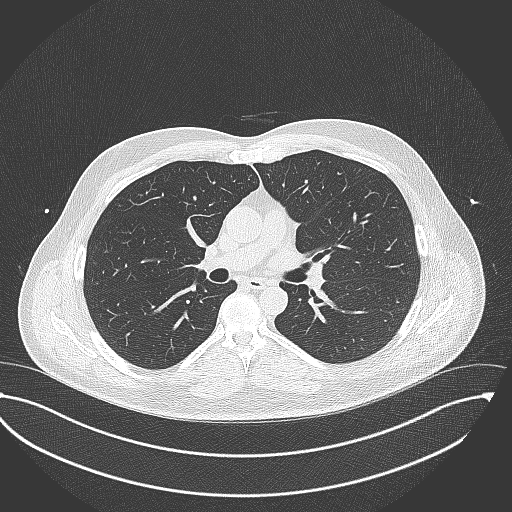

[Series 3: cascseq 3.0 sa36 70% (id) · axial · 0.39mm/px · z∈[+1068,+1152]mm · 3 of 57 slices shown]
[im 15/57  vessel]
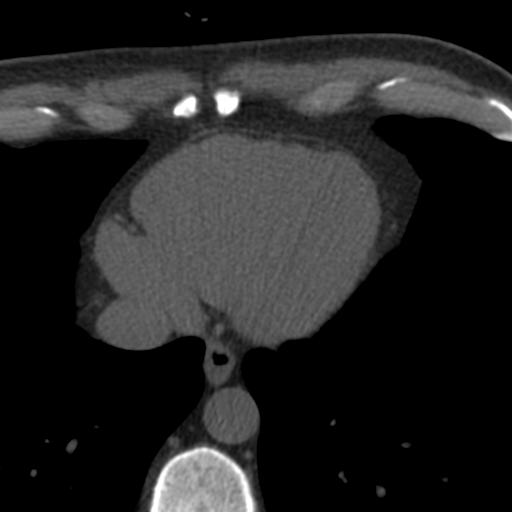
[im 29/57  vessel]
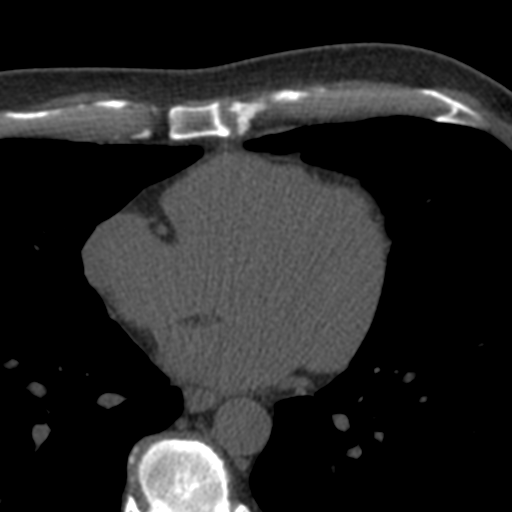
[im 43/57  vessel]
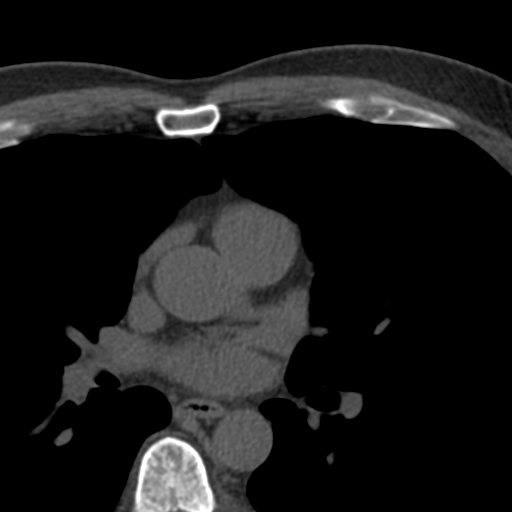

[Series 4: ax st · axial · 0.86mm/px · z∈[+1048,+1168]mm · 6 of 86 slices shown, 8 images]
[im 13/86  vessel]
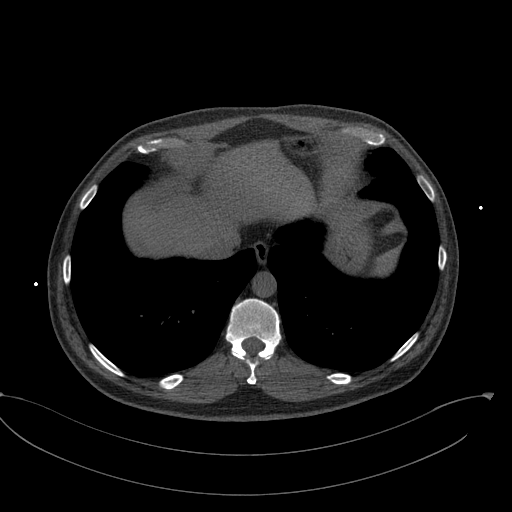
[im 13/86  lung]
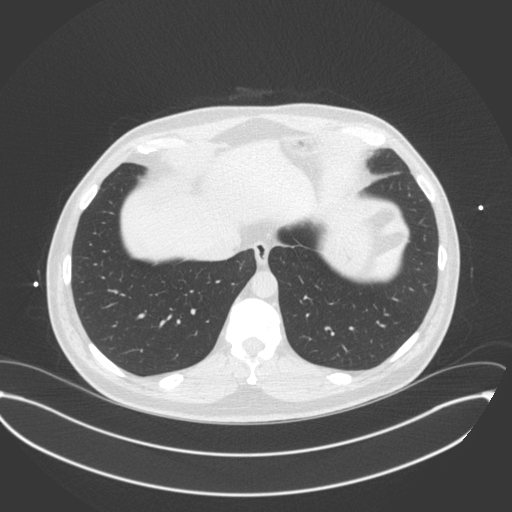
[im 25/86  vessel]
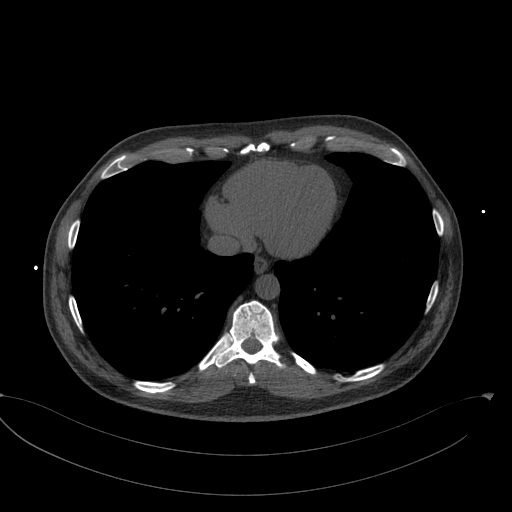
[im 37/86  vessel]
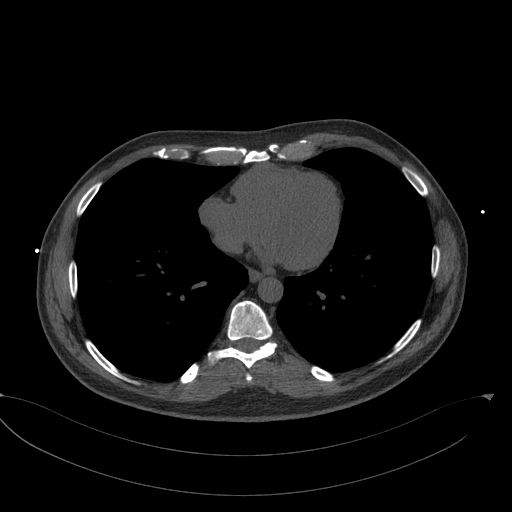
[im 49/86  vessel]
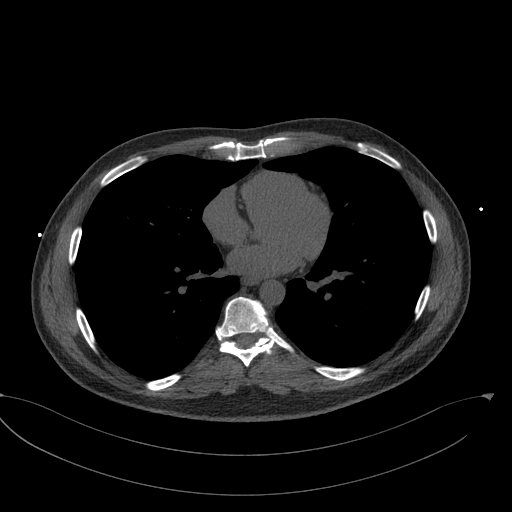
[im 61/86  vessel]
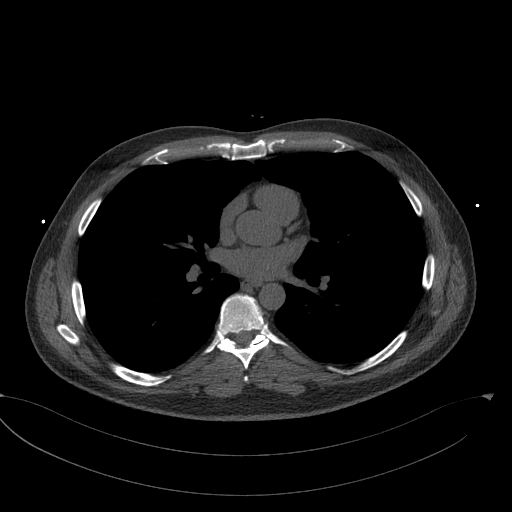
[im 61/86  lung]
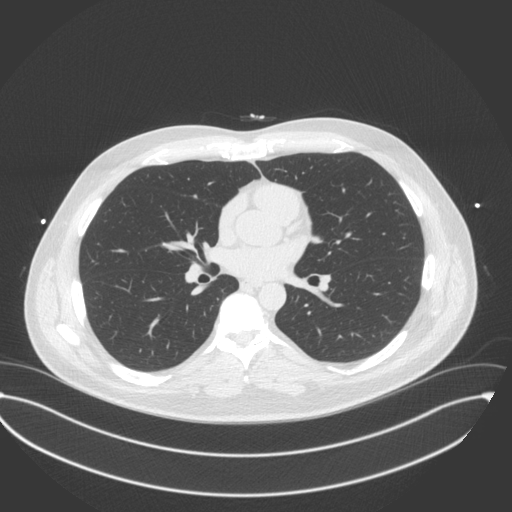
[im 73/86  vessel]
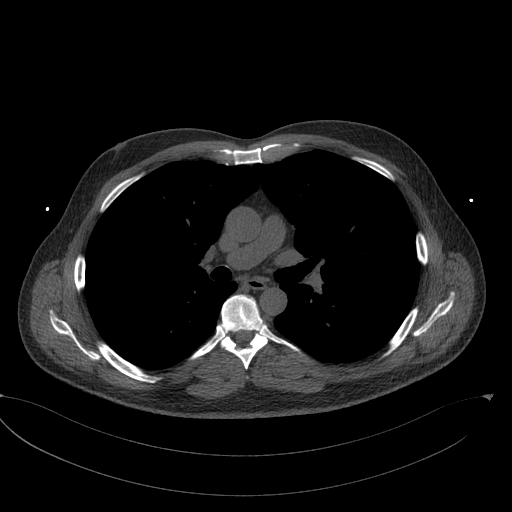

[14 of 20 positions shown; findings below may reference images not displayed]

FINDINGS: Coronary arteries: Normal origins.

Coronary Calcium Score:

Left main: 0

Left anterior descending artery: 0

Left circumflex artery: 0

Right coronary artery: 0

Total: 0

Percentile: 0

Pericardium: Normal.

Ascending Aorta: Normal caliber.

Non-cardiac: See separate report from [REDACTED].
IMPRESSION: Coronary calcium score of 0. This is a low risk study.



If CAC=0, it is reasonable to withhold statin therapy and reassess
in 5 to 10 years, as long as higher risk conditions are absent
(diabetes mellitus, family history of premature CHD in first degree
relatives (males <55 years; females <65 years), cigarette smoking,
or LDL >=190 mg/dL).

If CAC is 1 to 99, it is reasonable to initiate statin therapy for
patients >=55 years of age.

If CAC is >=100 or >=75th percentile, it is reasonable to initiate
statin therapy at any age.

Cardiology referral should be considered for patients with CAC
scores >=400 or >=75th percentile.

*0086 AHA/ACC/AACVPR/AAPA/ABC/TELAS/KOBO/VAU DEJES/Laury/NMARIG/BLONDINACKA/WILLIAM KWEKU
Guideline on the Management of Blood Cholesterol: A Report of the
American College of Cardiology/American Heart Association Task Force
on Clinical Practice Guidelines. J Am Coll Cardiol.
3674;73(24):9203-9412.

EXAM:
OVER-READ INTERPRETATION  CT CHEST

The following report is an over-read performed by radiologist Dr.
over-read does not include interpretation of cardiac or coronary
anatomy or pathology. The CT calcium interpretation by the
cardiologist is attached.
FINDINGS: Limited view of the lung parenchyma demonstrates no suspicious
nodularity. Airways are normal.

Limited view of the mediastinum demonstrates no adenopathy.
Esophagus normal.

Limited view of the upper abdomen unremarkable.

Limited view of the skeleton and chest wall is unremarkable.
IMPRESSION: No significant extracardiac findings.

*** End of Addendum ***
FINDINGS: Coronary arteries: Normal origins.

Coronary Calcium Score:

Left main: 0

Left anterior descending artery: 0

Left circumflex artery: 0

Right coronary artery: 0

Total: 0

Percentile: 0

Pericardium: Normal.

Ascending Aorta: Normal caliber.

Non-cardiac: See separate report from [REDACTED].
IMPRESSION: Coronary calcium score of 0. This is a low risk study.



If CAC=0, it is reasonable to withhold statin therapy and reassess
in 5 to 10 years, as long as higher risk conditions are absent
(diabetes mellitus, family history of premature CHD in first degree
relatives (males <55 years; females <65 years), cigarette smoking,
or LDL >=190 mg/dL).

If CAC is 1 to 99, it is reasonable to initiate statin therapy for
patients >=55 years of age.

If CAC is >=100 or >=75th percentile, it is reasonable to initiate
statin therapy at any age.

Cardiology referral should be considered for patients with CAC
scores >=400 or >=75th percentile.

*0086 AHA/ACC/AACVPR/AAPA/ABC/TELAS/KOBO/VAU DEJES/Laury/NMARIG/BLONDINACKA/WILLIAM KWEKU
Guideline on the Management of Blood Cholesterol: A Report of the
American College of Cardiology/American Heart Association Task Force
on Clinical Practice Guidelines. J Am Coll Cardiol.
3674;73(24):9203-9412.

## 2023-10-01 DIAGNOSIS — K635 Polyp of colon: Secondary | ICD-10-CM | POA: Diagnosis not present

## 2023-10-01 DIAGNOSIS — Z1211 Encounter for screening for malignant neoplasm of colon: Secondary | ICD-10-CM | POA: Diagnosis not present

## 2023-10-01 DIAGNOSIS — K5730 Diverticulosis of large intestine without perforation or abscess without bleeding: Secondary | ICD-10-CM | POA: Diagnosis not present

## 2023-11-26 DIAGNOSIS — L814 Other melanin hyperpigmentation: Secondary | ICD-10-CM | POA: Diagnosis not present

## 2023-11-26 DIAGNOSIS — D2371 Other benign neoplasm of skin of right lower limb, including hip: Secondary | ICD-10-CM | POA: Diagnosis not present

## 2023-11-26 DIAGNOSIS — D1801 Hemangioma of skin and subcutaneous tissue: Secondary | ICD-10-CM | POA: Diagnosis not present

## 2023-11-26 DIAGNOSIS — D2261 Melanocytic nevi of right upper limb, including shoulder: Secondary | ICD-10-CM | POA: Diagnosis not present

## 2023-11-26 DIAGNOSIS — L821 Other seborrheic keratosis: Secondary | ICD-10-CM | POA: Diagnosis not present

## 2023-11-26 DIAGNOSIS — D225 Melanocytic nevi of trunk: Secondary | ICD-10-CM | POA: Diagnosis not present

## 2023-11-26 DIAGNOSIS — L570 Actinic keratosis: Secondary | ICD-10-CM | POA: Diagnosis not present

## 2023-11-26 DIAGNOSIS — D692 Other nonthrombocytopenic purpura: Secondary | ICD-10-CM | POA: Diagnosis not present

## 2024-04-04 DIAGNOSIS — K4090 Unilateral inguinal hernia, without obstruction or gangrene, not specified as recurrent: Secondary | ICD-10-CM | POA: Diagnosis not present

## 2024-04-07 DIAGNOSIS — R0683 Snoring: Secondary | ICD-10-CM | POA: Diagnosis not present

## 2024-04-28 DIAGNOSIS — Z125 Encounter for screening for malignant neoplasm of prostate: Secondary | ICD-10-CM | POA: Diagnosis not present

## 2024-04-28 DIAGNOSIS — E7800 Pure hypercholesterolemia, unspecified: Secondary | ICD-10-CM | POA: Diagnosis not present

## 2024-05-10 DIAGNOSIS — G4733 Obstructive sleep apnea (adult) (pediatric): Secondary | ICD-10-CM | POA: Diagnosis not present

## 2024-05-20 DIAGNOSIS — G4733 Obstructive sleep apnea (adult) (pediatric): Secondary | ICD-10-CM | POA: Diagnosis not present

## 2024-07-21 ENCOUNTER — Ambulatory Visit: Admitting: Podiatry

## 2024-07-21 ENCOUNTER — Encounter: Payer: Self-pay | Admitting: Podiatry

## 2024-07-21 ENCOUNTER — Ambulatory Visit (INDEPENDENT_AMBULATORY_CARE_PROVIDER_SITE_OTHER)

## 2024-07-21 VITALS — Ht 72.0 in | Wt 182.0 lb

## 2024-07-21 DIAGNOSIS — M778 Other enthesopathies, not elsewhere classified: Secondary | ICD-10-CM

## 2024-07-21 DIAGNOSIS — M7751 Other enthesopathy of right foot: Secondary | ICD-10-CM

## 2024-07-21 DIAGNOSIS — M7752 Other enthesopathy of left foot: Secondary | ICD-10-CM | POA: Diagnosis not present

## 2024-07-21 NOTE — Progress Notes (Signed)
   No chief complaint on file.   HPI: 63 y.o. male presenting today for evaluation of intermittent tenderness to the lateral column of the left foot ongoing for about 3 months.  No injury.  He also has generalized bilateral ankle stiffness especially in the mornings  Past Medical History:  Diagnosis Date   Anxiety    Back pain    Hyperlipidemia    Kidney stone     Past Surgical History:  Procedure Laterality Date   MOUTH SURGERY      No Known Allergies   Physical Exam: General: The patient is alert and oriented x3 in no acute distress.  Dermatology: Skin is warm, dry and supple bilateral lower extremities.   Vascular: Palpable pedal pulses bilaterally. Capillary refill within normal limits.  No appreciable edema.  No erythema.  Neurological: Grossly intact via light touch  Musculoskeletal Exam: No pedal deformities noted  Radiographic Exam B/L foot and ankle 07/21/2024:  Normal osseous mineralization. Joint spaces preserved.  No fractures or osseous irregularities noted.  Impression: Negative  Assessment/Plan of Care: 1.  Capsulitis around the fifth metatarsal tubercle of the left foot; intermittent 2.  Generalized ankle stiffness bilateral  -Patient evaluated.  X-rays reviewed -Recommend good supportive tennis shoes that do not irritate or constrict the lateral column of the foot -Recommend daily activity.  He also does range of motion exercises with his ankles daily.  Continue -Return to clinic PRN       Thresa EMERSON Sar, DPM [facility]  Dr. Thresa EMERSON Sar, DPM    [address] [place], [place] [address]                Office [phone]  Fax [phone]

## 2024-08-26 ENCOUNTER — Other Ambulatory Visit: Payer: Self-pay | Admitting: Surgery
